# Patient Record
Sex: Male | Born: 1990
Health system: Southern US, Community
[De-identification: ages and names within clinical notes are randomized; demographics above are authoritative.]

## PROBLEM LIST (undated history)

## (undated) HISTORY — PX: TONSILLECTOMY: SUR1361

---

## 2011-05-26 ENCOUNTER — Ambulatory Visit (INDEPENDENT_AMBULATORY_CARE_PROVIDER_SITE_OTHER): Payer: 59 | Admitting: Pediatrics

## 2011-05-26 DIAGNOSIS — Z23 Encounter for immunization: Secondary | ICD-10-CM

## 2013-11-28 ENCOUNTER — Other Ambulatory Visit: Payer: Self-pay | Admitting: Geriatric Medicine

## 2013-11-28 DIAGNOSIS — R945 Abnormal results of liver function studies: Principal | ICD-10-CM

## 2013-11-28 DIAGNOSIS — R7989 Other specified abnormal findings of blood chemistry: Secondary | ICD-10-CM

## 2013-11-30 ENCOUNTER — Ambulatory Visit (HOSPITAL_COMMUNITY)
Admission: RE | Admit: 2013-11-30 | Discharge: 2013-11-30 | Disposition: A | Discharge status: Home / Self Care | Payer: 59 | Source: Ambulatory Visit | Attending: Geriatric Medicine | Admitting: Geriatric Medicine

## 2013-11-30 DIAGNOSIS — R945 Abnormal results of liver function studies: Secondary | ICD-10-CM | POA: Insufficient documentation

## 2013-11-30 DIAGNOSIS — R7989 Other specified abnormal findings of blood chemistry: Secondary | ICD-10-CM

## 2013-11-30 DIAGNOSIS — R933 Abnormal findings on diagnostic imaging of other parts of digestive tract: Secondary | ICD-10-CM | POA: Insufficient documentation

## 2014-05-20 ENCOUNTER — Encounter (HOSPITAL_COMMUNITY): Payer: Self-pay | Admitting: Emergency Medicine

## 2014-05-20 ENCOUNTER — Emergency Department (HOSPITAL_COMMUNITY)
Admission: EM | Admit: 2014-05-20 | Discharge: 2014-05-20 | Disposition: A | Discharge status: Home / Self Care | Payer: 59 | Source: Home / Self Care | Attending: Family Medicine | Admitting: Family Medicine

## 2014-05-20 DIAGNOSIS — Z4802 Encounter for removal of sutures: Secondary | ICD-10-CM

## 2014-05-20 MED ORDER — BACITRACIN ZINC 500 UNIT/GM EX OINT
TOPICAL_OINTMENT | CUTANEOUS | Status: AC
  Filled 2014-05-20: qty 0.9

## 2014-05-20 NOTE — Discharge Instructions (Signed)
Return as needed, 

## 2014-05-20 NOTE — ED Notes (Signed)
Wound check, poss suture removal

## 2014-05-20 NOTE — ED Provider Notes (Signed)
CSN: [id number]     Arrival date & time 05/20/14  1614 History   First MD Initiated Contact with Patient 05/20/14 1626     Chief Complaint  Patient presents with  . Wound Check   (Consider location/radiation/quality/duration/timing/severity/associated sxs/prior Treatment) Patient is a 23 y.o. male presenting with wound check. The history is provided by the patient.  Wound Check This is a new problem. The current episode started more than 1 week ago (11 d s/p lac repair out of town to left 5th finger , no complaints.). The problem has been rapidly improving.    History reviewed. No pertinent past medical history. History reviewed. No pertinent past surgical history. History reviewed. No pertinent family history. History  Substance Use Topics  . Smoking status: Never Smoker   . Smokeless tobacco: Not on file  . Alcohol Use: Not on file    Review of Systems  Constitutional: Negative.   Skin: Positive for wound.    Allergies  Amoxicillin and Penicillins  Home Medications   Prior to Admission medications   Medication Sig Start Date End Date Taking? Authorizing Provider  fexofenadine (ALLEGRA) 180 MG tablet Take 180 mg by mouth daily.   Yes Historical Provider, MD   BP 155/83  Pulse 90  Temp(Src) 97.9 F (36.6 C) (Oral)  Resp 16  SpO2 99% Physical Exam  Nursing note and vitals reviewed. Constitutional: He is oriented to person, place, and time. He appears well-developed and well-nourished.  Musculoskeletal: He exhibits no tenderness.  8 stitches to left 5th finger. Well healed, no problems. Sutures removed. dsd.  Neurological: He is alert and oriented to person, place, and time.  Skin: Skin is warm and dry.    ED Course  Procedures (including critical care time) Labs Review Labs Reviewed - No data to display  Imaging Review No results found.   MDM   1. Visit for suture removal        Linna HoffJames D Ed Rayson, MD 05/20/14 [phone]

## 2014-07-06 IMAGING — US US ABDOMEN COMPLETE
1 series · 14 of 25 positions shown · non-contrast
Comparison: None.

CLINICAL DATA: Elevated liver functional tests

EXAM:
ULTRASOUND ABDOMEN COMPLETE

[Series 1: us abdomen complete · 0.29mm/px · 14 of 89 slices shown]
[im 1/89]
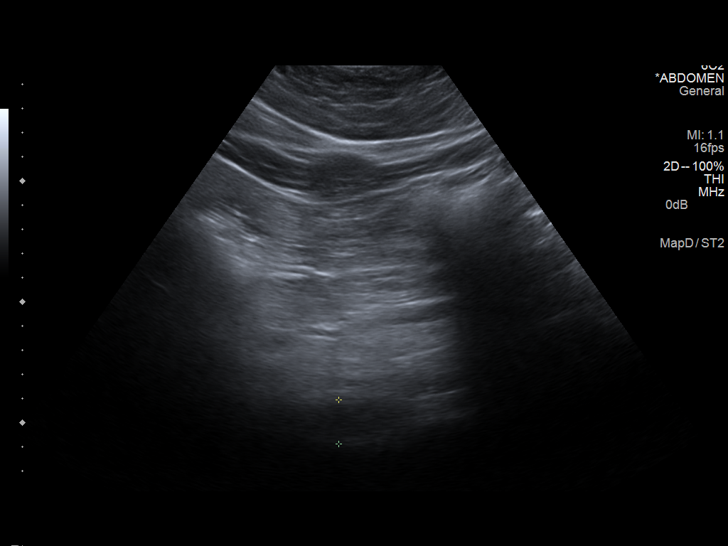
[im 8/89]
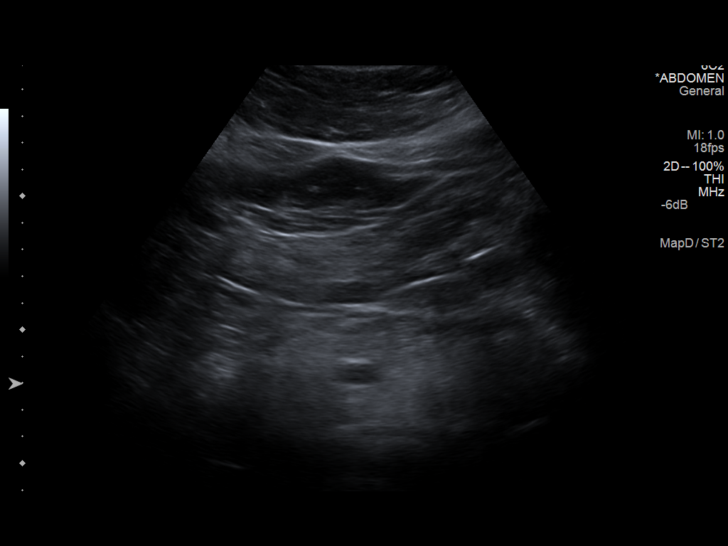
[im 15/89]
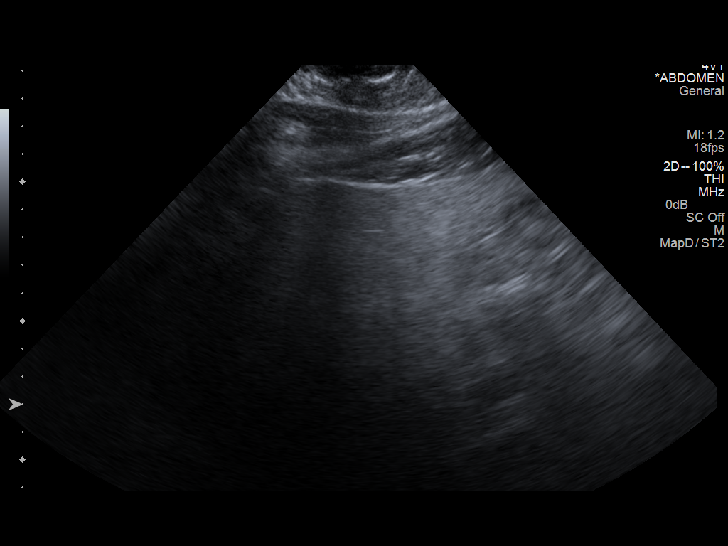
[im 23/89]
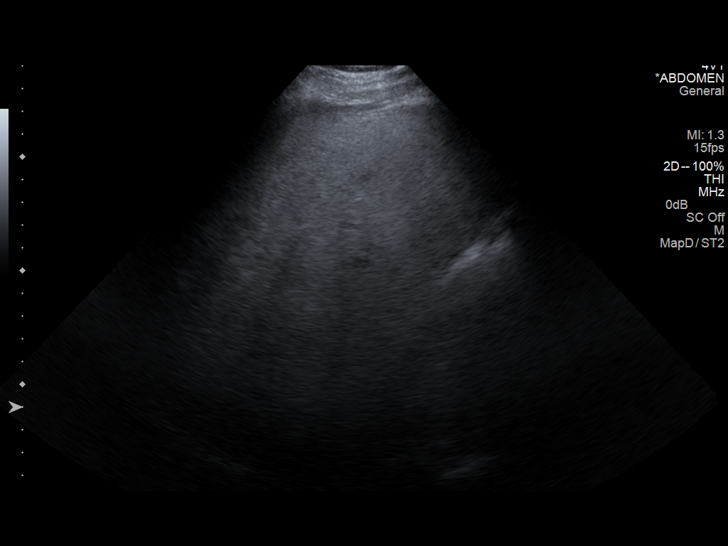
[im 30/89]
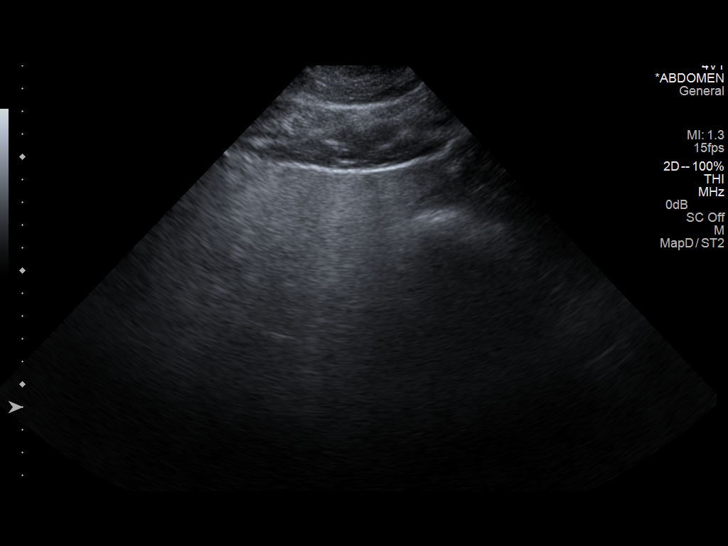
[im 34/89]
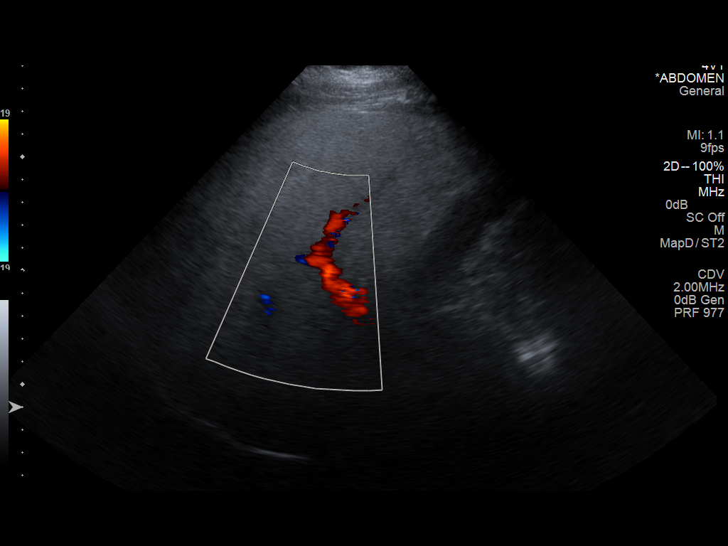
[im 41/89]
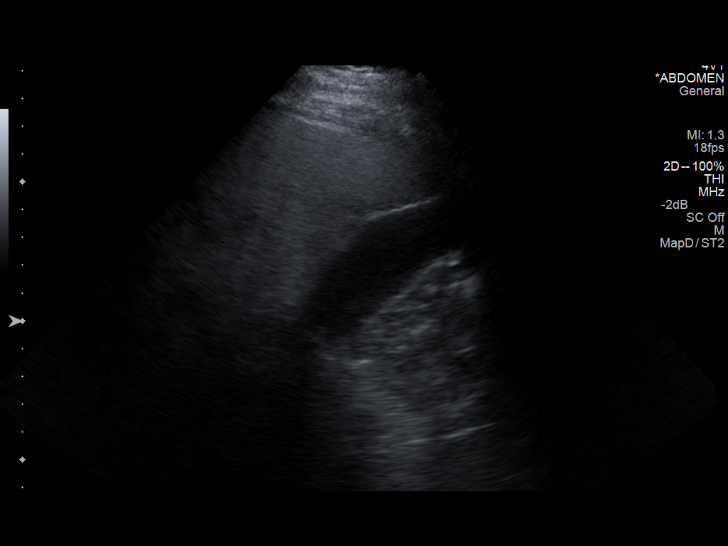
[im 48/89]
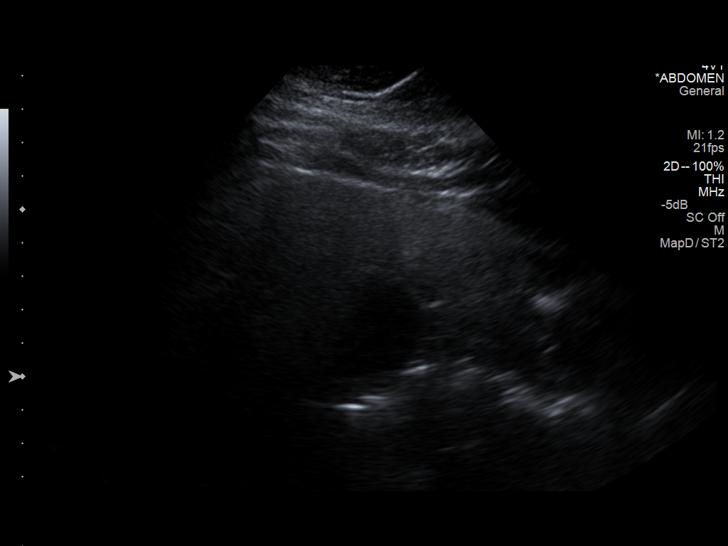
[im 56/89]
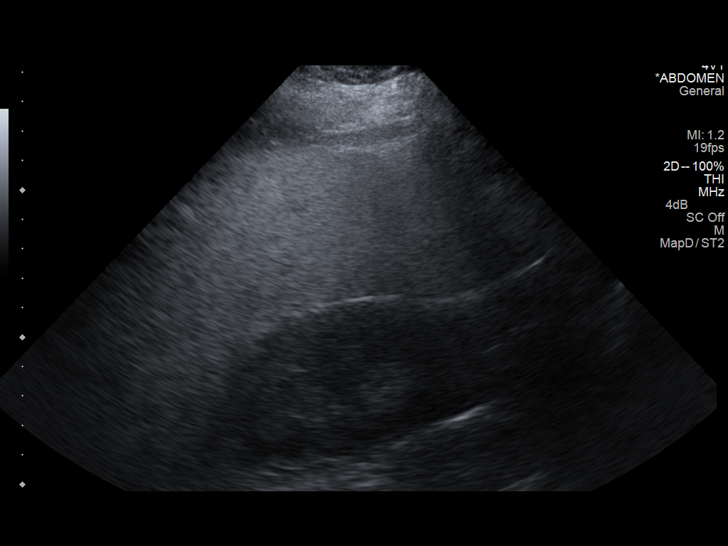
[im 59/89]
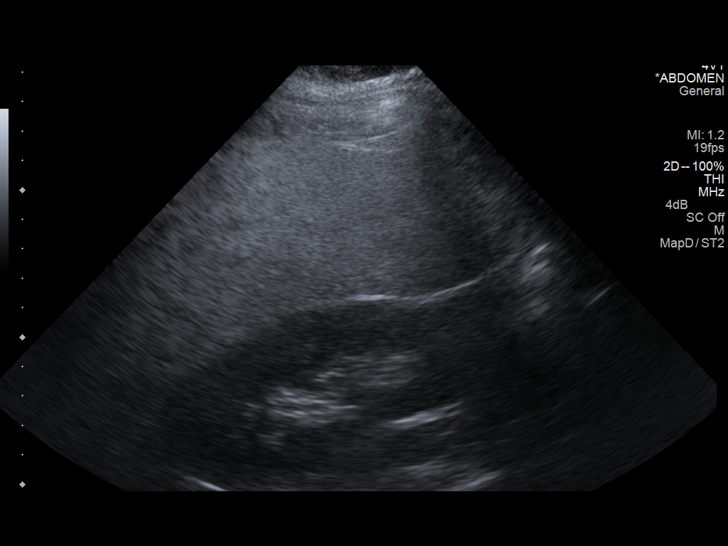
[im 67/89]
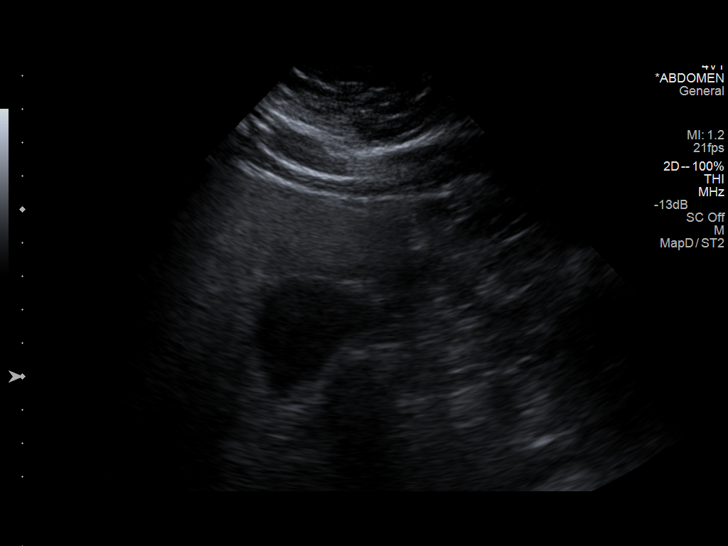
[im 74/89]
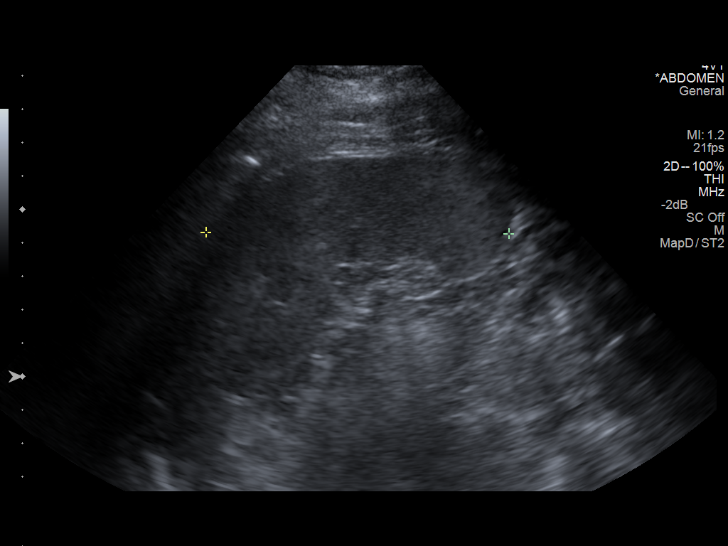
[im 81/89]
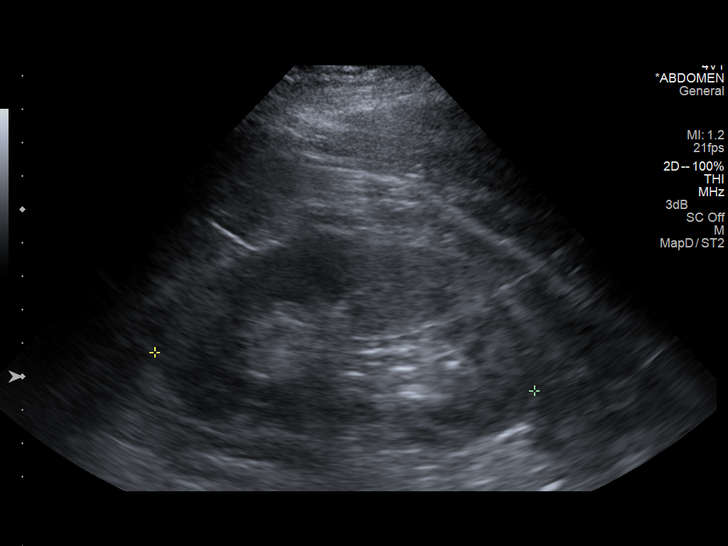
[im 89/89]
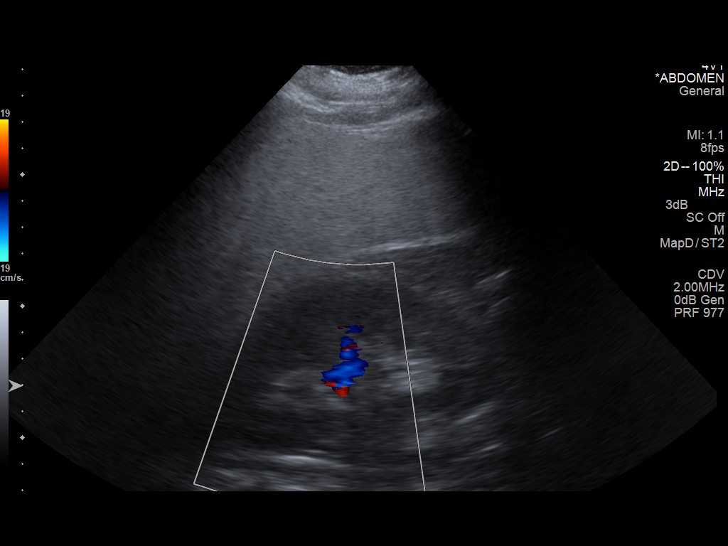

[14 of 25 positions shown; findings below may reference images not displayed]

FINDINGS: Gallbladder:

No gallstones or wall thickening visualized. No sonographic Murphy
sign noted.

Common bile duct:

Diameter:  4 mm.

Liver:

No focal hepatic lesion.  Increased diffuse hepatic echogenicity.

IVC:

No abnormality visualized.

Pancreas:

Visualized portion unremarkable.

Spleen:

Size and appearance within normal limits.

Right Kidney:

Length: 12.0 cm. Echogenicity within normal limits. No mass or
hydronephrosis visualized.

Left Kidney:

Length: 11.4 cm. Echogenicity within normal limits. No mass or
hydronephrosis visualized.

Abdominal aorta:

No aneurysm visualized.

Other findings:

None.
IMPRESSION: 1. Echogenic liver as can be seen with hepatic steatosis. Hepatic
ultrasound elastography may be helpful for evaluating hepatocellular
disease.

## 2016-09-09 DIAGNOSIS — J101 Influenza due to other identified influenza virus with other respiratory manifestations: Secondary | ICD-10-CM | POA: Diagnosis not present

## 2016-09-09 MED FILL — OSELTAMIVIR PHOS 75 MG CAP: 75 | 5 days supply | Qty: 10 | Fill #0

## 2017-01-18 DIAGNOSIS — J209 Acute bronchitis, unspecified: Secondary | ICD-10-CM | POA: Diagnosis not present

## 2017-01-18 MED FILL — DOXYCYCLINE HYCLATE 100 MG: 100 | 10 days supply | Qty: 20 | Fill #0

## 2017-01-18 MED FILL — CHERATUSSIN AC SYRUP: 100-10 | 10 days supply | Qty: 300 | Fill #0

## 2017-01-19 MED FILL — SULFAMETHOXAZOLE/TMP DS TAB: 800-160 | 7 days supply | Qty: 14 | Fill #0

## 2017-06-08 DIAGNOSIS — J069 Acute upper respiratory infection, unspecified: Secondary | ICD-10-CM | POA: Diagnosis not present

## 2017-08-22 DIAGNOSIS — J0110 Acute frontal sinusitis, unspecified: Secondary | ICD-10-CM | POA: Diagnosis not present

## 2017-08-22 MED FILL — DOXYCYCLINE HYCLATE 100 MG: 100 | 7 days supply | Qty: 14 | Fill #0

## 2017-08-29 DIAGNOSIS — Z6838 Body mass index (BMI) 38.0-38.9, adult: Secondary | ICD-10-CM | POA: Diagnosis not present

## 2017-08-29 DIAGNOSIS — R0982 Postnasal drip: Secondary | ICD-10-CM | POA: Diagnosis not present

## 2017-08-29 DIAGNOSIS — K760 Fatty (change of) liver, not elsewhere classified: Secondary | ICD-10-CM | POA: Diagnosis not present

## 2017-08-29 DIAGNOSIS — J0100 Acute maxillary sinusitis, unspecified: Secondary | ICD-10-CM | POA: Diagnosis not present

## 2017-08-29 DIAGNOSIS — R05 Cough: Secondary | ICD-10-CM | POA: Diagnosis not present

## 2017-08-29 DIAGNOSIS — E669 Obesity, unspecified: Secondary | ICD-10-CM | POA: Diagnosis not present

## 2017-08-29 MED FILL — SULFAMETHOXAZOLE-TMP DS TAB: 800-160 | 7 days supply | Qty: 14 | Fill #0

## 2017-10-19 DIAGNOSIS — R197 Diarrhea, unspecified: Secondary | ICD-10-CM | POA: Diagnosis not present

## 2017-10-19 DIAGNOSIS — R0981 Nasal congestion: Secondary | ICD-10-CM | POA: Diagnosis not present

## 2017-10-19 DIAGNOSIS — R05 Cough: Secondary | ICD-10-CM | POA: Diagnosis not present

## 2017-10-19 DIAGNOSIS — J0101 Acute recurrent maxillary sinusitis: Secondary | ICD-10-CM | POA: Diagnosis not present

## 2017-10-19 MED FILL — SULFAMETHOXAZOLE-TMP DS TAB: 800-160 | 7 days supply | Qty: 14 | Fill #0

## 2018-03-06 ENCOUNTER — Encounter (HOSPITAL_COMMUNITY): Payer: Self-pay | Admitting: Emergency Medicine

## 2018-03-06 ENCOUNTER — Ambulatory Visit (HOSPITAL_COMMUNITY)
Admission: EM | Admit: 2018-03-06 | Discharge: 2018-03-06 | Disposition: A | Discharge status: Home / Self Care | Payer: 59 | Attending: Family Medicine | Admitting: Family Medicine

## 2018-03-06 DIAGNOSIS — J018 Other acute sinusitis: Secondary | ICD-10-CM

## 2018-03-06 DIAGNOSIS — J069 Acute upper respiratory infection, unspecified: Secondary | ICD-10-CM | POA: Diagnosis not present

## 2018-03-06 MED ORDER — TRIAMCINOLONE ACETONIDE 55 MCG/ACT NA AERO: 2.0000 | INHALATION_SPRAY | Freq: Every day | NASAL | 12 refills | Status: AC

## 2018-03-06 MED ORDER — SALINE SPRAY 0.65 % NA SOLN
1.0000 | NASAL | 0 refills | Status: AC | PRN
Start: 2018-03-06 — End: ?

## 2018-03-06 MED ORDER — IBUPROFEN 800 MG PO TABS
800.0000 mg | ORAL_TABLET | Freq: Three times a day (TID) | ORAL | 0 refills | Status: AC
Start: 2018-03-06 — End: ?

## 2018-03-06 MED ORDER — GUAIFENESIN ER 600 MG PO TB12
1200.0000 mg | ORAL_TABLET | Freq: Two times a day (BID) | ORAL | 0 refills | Status: AC | PRN
Start: 2018-03-06 — End: 2018-03-11

## 2018-03-06 MED FILL — IBUPROFEN 800 MG TAB: 800 | 10 days supply | Qty: 30 | Fill #0

## 2018-03-06 NOTE — ED Triage Notes (Signed)
PT reports headache, facial pressure, congestion, cough, sore throat since Friday.

## 2018-03-06 NOTE — ED Provider Notes (Signed)
[facility]    CSN: [id number] Arrival date & time: 03/06/18  1304     History   Chief Complaint Chief Complaint  Patient presents with  . URI    HPI Kevin Park is a 27 y.o. male.   Kevin Park presents with complaints of productive cough, runny nose, facial pressure, sore throat, which started three days ago. No known fevers but had hot flashes last night. No known ill contacts. Has not taken any medications for symptoms. Symptoms felt worse this morning. Pain 5/10. Headache. No rash. No gi/gu complaints. Without contributing medical history.      ROS per HPI.      History reviewed. No pertinent past medical history.  There are no active problems to display for this patient.   Past Surgical History:  Procedure Laterality Date  . TONSILLECTOMY         Home Medications    Prior to Admission medications   Medication Sig Start Date End Date Taking? Authorizing Provider  fexofenadine (ALLEGRA) 180 MG tablet Take 180 mg by mouth daily.   Yes [provider]  guaiFENesin (MUCINEX) 600 MG 12 hr tablet Take 2 tablets (1,200 mg total) by mouth 2 (two) times daily as needed for up to 5 days. 03/06/18 03/11/18  Georgetta HaberBurky, Orman Matsumura B, NP  ibuprofen (ADVIL,MOTRIN) 800 MG tablet Take 1 tablet (800 mg total) by mouth 3 (three) times daily. 03/06/18   Georgetta HaberBurky, Kawhi Diebold B, NP  sodium chloride (OCEAN) 0.65 % SOLN nasal spray Place 1 spray into both nostrils as needed for congestion. 03/06/18   Georgetta HaberBurky, Charnel Giles B, NP  triamcinolone (NASACORT) 55 MCG/ACT AERO nasal inhaler Place 2 sprays into the nose daily. 03/06/18   Georgetta HaberBurky, Jahleel Stroschein B, NP    Family History No family history on file.  Social History Social History   Tobacco Use  . Smoking status: Never Smoker  Substance Use Topics  . Alcohol use: Not on file  . Drug use: Not on file     Allergies   Amoxicillin and Penicillins   Review of Systems Review of Systems   Physical Exam Triage Vital Signs ED Triage  Vitals  Enc Vitals Group     BP 03/06/18 1359 140/85     Pulse Rate 03/06/18 1359 83     Resp 03/06/18 1359 16     Temp 03/06/18 1359 98.4 F (36.9 C)     Temp Source 03/06/18 1359 Oral     SpO2 03/06/18 1359 98 %     Weight --      Height --      Head Circumference --      Peak Flow --      Pain Score 03/06/18 1403 6     Pain Loc --      Pain Edu? --      Excl. in GC? --    No data found.  Updated Vital Signs BP 140/85 (BP Location: Right Arm)   Pulse 83   Temp 98.4 F (36.9 C) (Oral)   Resp 16   SpO2 98%    Physical Exam  Constitutional: He is oriented to person, place, and time. He appears well-developed and well-nourished.  HENT:  Head: Normocephalic and atraumatic.  Right Ear: Tympanic membrane, external ear and ear canal normal.  Left Ear: Tympanic membrane, external ear and ear canal normal.  Nose: Right sinus exhibits maxillary sinus tenderness. Right sinus exhibits no frontal sinus tenderness. Left sinus exhibits maxillary sinus tenderness. Left sinus exhibits  no frontal sinus tenderness.  Mouth/Throat: Uvula is midline, oropharynx is clear and moist and mucous membranes are normal. Tonsils are 0 on the right. Tonsils are 0 on the left.  Eyes: Pupils are equal, round, and reactive to light. Conjunctivae are normal.  Neck: Normal range of motion.  Cardiovascular: Normal rate and regular rhythm.  Pulmonary/Chest: Effort normal and breath sounds normal.  Lymphadenopathy:    He has no cervical adenopathy.  Neurological: He is alert and oriented to person, place, and time.  Skin: Skin is warm and dry.  Vitals reviewed.    UC Treatments / Results  Labs (all labs ordered are listed, but only abnormal results are displayed) Labs Reviewed - No data to display  EKG None  Radiology No results found.  Procedures Procedures (including critical care time)  Medications Ordered in UC Medications - No data to display  Initial Impression / Assessment and Plan  / UC Course  I have reviewed the triage vital signs and the nursing notes.  Pertinent labs & imaging results that were available during my care of the patient were reviewed by me and considered in my medical decision making (see chart for details).     Non toxic. Afebrile. Benign physical exam. History and physical consistent with viral illness.  Supportive cares recommended. If symptoms worsen or do not improve in the next week to return to be seen or to follow up with PCP.  Patient verbalized understanding and agreeable to plan.    Final Clinical Impressions(s) / UC Diagnoses   Final diagnoses:  Viral upper respiratory tract infection  Acute non-recurrent sinusitis of other sinus     Discharge Instructions     Push fluids to ensure adequate hydration and keep secretions thin.  Tylenol and/or ibuprofen as needed for pain or fevers.  Daily nasacort. Plain nasal saline may also help with congestion and facial pressure, may use as needed throughout the day.  Mucinex may further help loosen secretions.  If symptoms worsen or do not improve in the next week to return to be seen or to follow up with your PCP.      ED Prescriptions    Medication Sig Dispense Auth. Provider   triamcinolone (NASACORT) 55 MCG/ACT AERO nasal inhaler Place 2 sprays into the nose daily. 1 Inhaler Linus Mako B, NP   guaiFENesin (MUCINEX) 600 MG 12 hr tablet Take 2 tablets (1,200 mg total) by mouth 2 (two) times daily as needed for up to 5 days. 20 tablet Linus Mako B, NP   ibuprofen (ADVIL,MOTRIN) 800 MG tablet Take 1 tablet (800 mg total) by mouth 3 (three) times daily. 30 tablet Linus Mako B, NP   sodium chloride (OCEAN) 0.65 % SOLN nasal spray Place 1 spray into both nostrils as needed for congestion. 60 mL Linus Mako B, NP     Controlled Substance Prescriptions [place] Controlled Substance Registry consulted? Not Applicable   Georgetta Haber, NP 03/06/18 1428

## 2018-03-06 NOTE — Discharge Instructions (Addendum)
Push fluids to ensure adequate hydration and keep secretions thin.  Tylenol and/or ibuprofen as needed for pain or fevers.  Daily nasacort. Plain nasal saline may also help with congestion and facial pressure, may use as needed throughout the day.  Mucinex may further help loosen secretions.  If symptoms worsen or do not improve in the next week to return to be seen or to follow up with your PCP.

## 2018-03-10 ENCOUNTER — Ambulatory Visit (HOSPITAL_COMMUNITY)
Admission: EM | Admit: 2018-03-10 | Discharge: 2018-03-10 | Disposition: A | Discharge status: Home / Self Care | Payer: 59 | Attending: Family Medicine | Admitting: Family Medicine

## 2018-03-10 ENCOUNTER — Encounter (HOSPITAL_COMMUNITY): Payer: Self-pay

## 2018-03-10 DIAGNOSIS — J019 Acute sinusitis, unspecified: Secondary | ICD-10-CM | POA: Diagnosis not present

## 2018-03-10 MED ORDER — CEFDINIR 300 MG PO CAPS: 300.0000 mg | ORAL_CAPSULE | Freq: Two times a day (BID) | ORAL | 0 refills | Status: AC

## 2018-03-10 MED FILL — DOXYCYCLINE HYCLATE 100 MG: 100 | 7 days supply | Qty: 14 | Fill #0

## 2018-03-10 NOTE — ED Triage Notes (Signed)
Pt presents with sinus and flu like symptoms (nasal drainage, congestion, coughing up mucus, body aches)

## 2018-03-10 NOTE — ED Provider Notes (Signed)
[facility]    CSN: [id number] Arrival date & time: 03/10/18  1052     History   Chief Complaint Chief Complaint  Patient presents with  . Facial Pain  . Influenza    HPI Kevin Park is a 27 y.o. male history of tonsillectomy, Patient is presenting with URI symptoms- congestion, cough, sore throat. Patient's main complaints are congestion, persistence of symptoms. Symptoms have been going on for over 1 week. Patient has tried Allegra, Mucinex, Nasacort, over-the-counter cough syrups, with minimal relief. Denies fever, nausea, vomiting, diarrhea. Denies shortness of breath and chest pain.    HPI  History reviewed. No pertinent past medical history.  There are no active problems to display for this patient.   Past Surgical History:  Procedure Laterality Date  . TONSILLECTOMY         Home Medications    Prior to Admission medications   Medication Sig Start Date End Date Taking? Authorizing Provider  cefdinir (OMNICEF) 300 MG capsule Take 1 capsule (300 mg total) by mouth 2 (two) times daily for 7 days. 03/10/18 03/17/18  Carle Dargan C, PA-C  fexofenadine (ALLEGRA) 180 MG tablet Take 180 mg by mouth daily.    [provider]  guaiFENesin (MUCINEX) 600 MG 12 hr tablet Take 2 tablets (1,200 mg total) by mouth 2 (two) times daily as needed for up to 5 days. 03/06/18 03/11/18  Georgetta Haber, NP  ibuprofen (ADVIL,MOTRIN) 800 MG tablet Take 1 tablet (800 mg total) by mouth 3 (three) times daily. 03/06/18   Georgetta Haber, NP  sodium chloride (OCEAN) 0.65 % SOLN nasal spray Place 1 spray into both nostrils as needed for congestion. 03/06/18   Georgetta Haber, NP  triamcinolone (NASACORT) 55 MCG/ACT AERO nasal inhaler Place 2 sprays into the nose daily. 03/06/18   Georgetta Haber, NP    Family History History reviewed. No pertinent family history.  Social History Social History   Tobacco Use  . Smoking status: Never Smoker  Substance Use Topics  .  Alcohol use: Not on file  . Drug use: Not on file     Allergies   Amoxicillin and Penicillins   Review of Systems Review of Systems  Constitutional: Negative for activity change, appetite change, chills, fatigue and fever.  HENT: Positive for congestion, rhinorrhea, sinus pressure and sore throat. Negative for ear pain and trouble swallowing.   Eyes: Negative for discharge and redness.  Respiratory: Positive for cough. Negative for chest tightness and shortness of breath.   Cardiovascular: Negative for chest pain.  Gastrointestinal: Negative for abdominal pain, diarrhea, nausea and vomiting.  Musculoskeletal: Positive for myalgias.  Skin: Negative for rash.  Neurological: Negative for dizziness, light-headedness and headaches.     Physical Exam Triage Vital Signs ED Triage Vitals [03/10/18 1108]  Enc Vitals Group     BP (!) 144/90     Pulse Rate 97     Resp 20     Temp 98.1 F (36.7 C)     Temp Source Oral     SpO2 98 %     Weight      Height      Head Circumference      Peak Flow      Pain Score      Pain Loc      Pain Edu?      Excl. in GC?    No data found.  Updated Vital Signs BP (!) 144/90 (BP Location: Right Arm)  Pulse 97   Temp 98.1 F (36.7 C) (Oral)   Resp 20   SpO2 98%   Visual Acuity Right Eye Distance:   Left Eye Distance:   Bilateral Distance:    Right Eye Near:   Left Eye Near:    Bilateral Near:     Physical Exam  Constitutional: He appears well-developed and well-nourished.  No acute distress  HENT:  Head: Normocephalic and atraumatic.  Bilateral ears without tenderness to palpation of external auricle, tragus and mastoid, EAC's without erythema or swelling, TM's with good bony landmarks and cone of light. Non erythematous.  Nasal mucosa erythematous, clear rhinorrhea present  Oral mucosa pink and moist, tonsils absent, posterior pharynx patent and erythematous, no uvula deviation or swelling. Normal phonation.   Eyes:  Conjunctivae are normal.  Neck: Neck supple.  Cardiovascular: Normal rate and regular rhythm.  No murmur heard. Pulmonary/Chest: Effort normal and breath sounds normal. No respiratory distress.  Breathing comfortably at rest, CTABL, no wheezing, rales or other adventitious sounds auscultated  Abdominal: Soft. There is no tenderness.  Musculoskeletal: He exhibits no edema.  Neurological: He is alert.  Skin: Skin is warm and dry.  Psychiatric: He has a normal mood and affect.  Nursing note and vitals reviewed.    UC Treatments / Results  Labs (all labs ordered are listed, but only abnormal results are displayed) Labs Reviewed - No data to display  EKG None  Radiology No results found.  Procedures Procedures (including critical care time)  Medications Ordered in UC Medications - No data to display  Initial Impression / Assessment and Plan / UC Course  I have reviewed the triage vital signs and the nursing notes.  Pertinent labs & imaging results that were available during my care of the patient were reviewed by me and considered in my medical decision making (see chart for details).     Patient with URI symptoms, viral versus sinusitis.  Given length of symptoms and worsening of symptoms despite symptomatic management, will provide with Omnicef to treat for sinusitis.  Penicillin allergic with rash.  Advised to continue symptomatic management.Discussed strict return precautions. Patient verbalized understanding and is agreeable with plan.  Final Clinical Impressions(s) / UC Diagnoses   Final diagnoses:  Acute sinusitis with symptoms > 10 days     Discharge Instructions     Please continue to use the Mucinex, Allegra and Nasacort Continue over-the-counter cough syrups Add in [place] twice daily for the next week Tylenol and ibuprofen for any fevers, pain, body aches  Return if developing shortness of breath, difficulty breathing, fevers, continuing worsening  symptoms, persistent symptoms without relief with treatment.   ED Prescriptions    Medication Sig Dispense Auth. Provider   cefdinir (OMNICEF) 300 MG capsule Take 1 capsule (300 mg total) by mouth 2 (two) times daily for 7 days. 14 capsule Seth Friedlander C, PA-C     Controlled Substance Prescriptions [place] Controlled Substance Registry consulted? Not Applicable   Lew DawesWieters, Westyn Keatley C, [place] JerseyPA-C 03/10/18 1127

## 2018-03-10 NOTE — Discharge Instructions (Signed)
Please continue to use the Mucinex, Allegra and Nasacort Continue over-the-counter cough syrups Add in [place] twice daily for the next week Tylenol and ibuprofen for any fevers, pain, body aches  Return if developing shortness of breath, difficulty breathing, fevers, continuing worsening symptoms, persistent symptoms without relief with treatment.

## 2018-06-12 DIAGNOSIS — J101 Influenza due to other identified influenza virus with other respiratory manifestations: Secondary | ICD-10-CM | POA: Diagnosis not present

## 2018-06-12 MED FILL — predniSONE 10 MG TABS: 10 | 6 days supply | Qty: 21 | Fill #0

## 2018-06-12 MED FILL — OSELTAMIVIR PHOSPHATE 75 MG: 75 | 5 days supply | Qty: 10 | Fill #0

## 2018-06-12 MED FILL — HYDROCODONE-CHLORPHEN ER SU: 10-8 | 10 days supply | Qty: 100 | Fill #0

## 2018-06-28 ENCOUNTER — Ambulatory Visit
Admission: RE | Admit: 2018-06-28 | Discharge: 2018-06-28 | Disposition: A | Discharge status: Home / Self Care | Payer: 59 | Source: Ambulatory Visit | Attending: Nurse Practitioner | Admitting: Nurse Practitioner

## 2018-06-28 ENCOUNTER — Other Ambulatory Visit: Payer: Self-pay | Admitting: Nurse Practitioner

## 2018-06-28 DIAGNOSIS — A689 Relapsing fever, unspecified: Secondary | ICD-10-CM

## 2018-06-28 MED FILL — CIPROFLOXACIN HCL 750 MG TA: 750 | 10 days supply | Qty: 20 | Fill #0

## 2018-07-03 DIAGNOSIS — J189 Pneumonia, unspecified organism: Secondary | ICD-10-CM | POA: Diagnosis not present

## 2018-07-03 DIAGNOSIS — R300 Dysuria: Secondary | ICD-10-CM | POA: Diagnosis not present

## 2018-07-04 ENCOUNTER — Other Ambulatory Visit (HOSPITAL_COMMUNITY): Payer: Self-pay | Admitting: *Deleted

## 2018-07-05 ENCOUNTER — Ambulatory Visit (HOSPITAL_COMMUNITY)
Admission: RE | Admit: 2018-07-05 | Discharge: 2018-07-05 | Disposition: A | Discharge status: Home / Self Care | Payer: 59 | Source: Ambulatory Visit | Attending: Nurse Practitioner | Admitting: Nurse Practitioner

## 2018-07-05 DIAGNOSIS — R42 Dizziness and giddiness: Secondary | ICD-10-CM | POA: Insufficient documentation

## 2018-07-05 DIAGNOSIS — J189 Pneumonia, unspecified organism: Secondary | ICD-10-CM | POA: Diagnosis not present

## 2018-07-05 MED ORDER — SODIUM CHLORIDE 0.9 % IV SOLN
INTRAVENOUS | Status: AC
  Administered 2018-07-05 (×2): via INTRAVENOUS

## 2018-07-05 MED ORDER — CIPROFLOXACIN IN D5W 400 MG/200ML IV SOLN
400.0000 mg | Freq: Once | INTRAVENOUS | Status: AC
  Administered 2018-07-05: 400 mg via INTRAVENOUS
  Filled 2018-07-05: qty 200

## 2018-12-18 DIAGNOSIS — Z20828 Contact with and (suspected) exposure to other viral communicable diseases: Secondary | ICD-10-CM | POA: Diagnosis not present

## 2019-02-01 IMAGING — DX DG CHEST 2V
2 series · 2 of 2 positions shown · non-contrast
Comparison: None.

CLINICAL DATA: Fever

EXAM:
CHEST - 2 VIEW

[dg chest 2 view (1 of 2)]
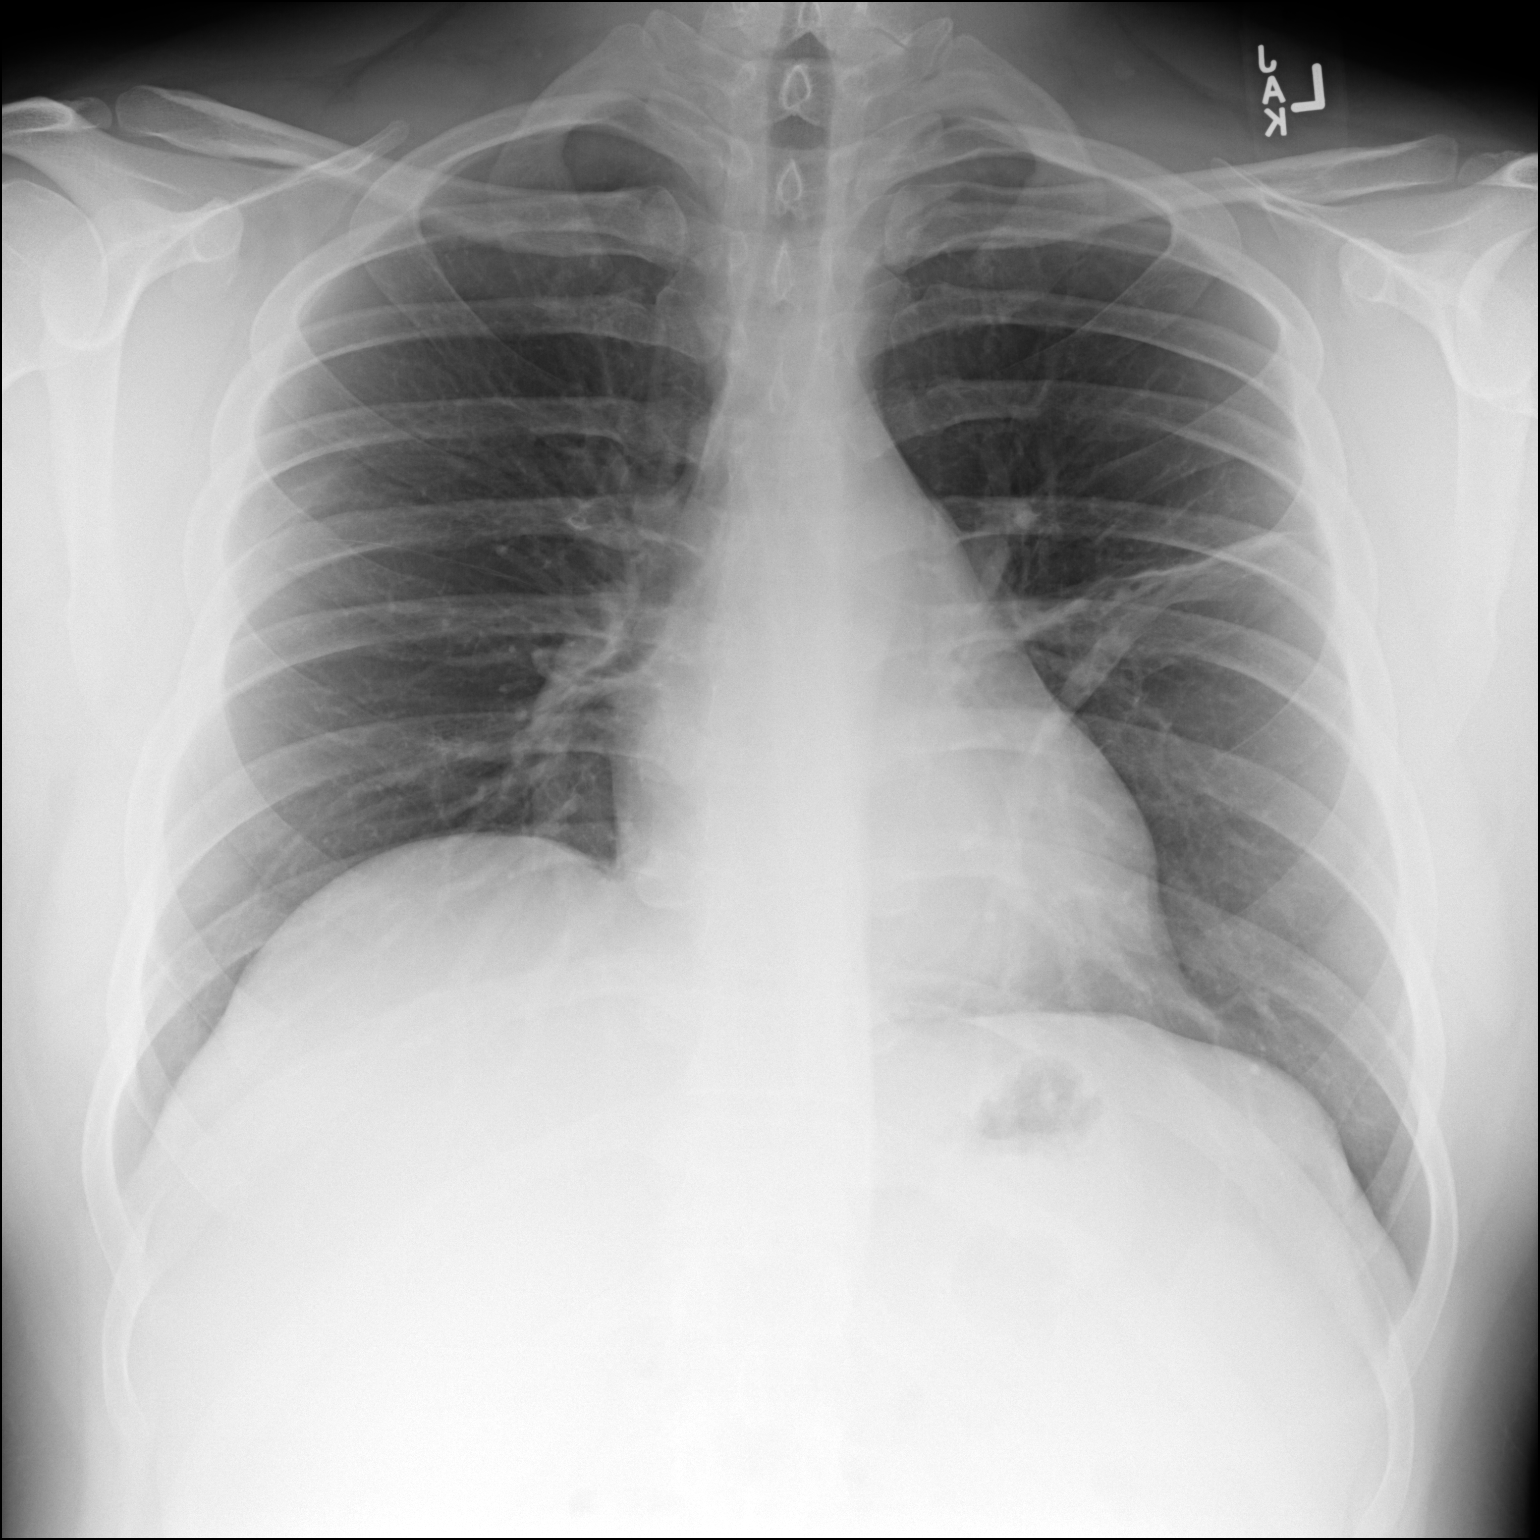

[dg chest 2 view (2 of 2)]
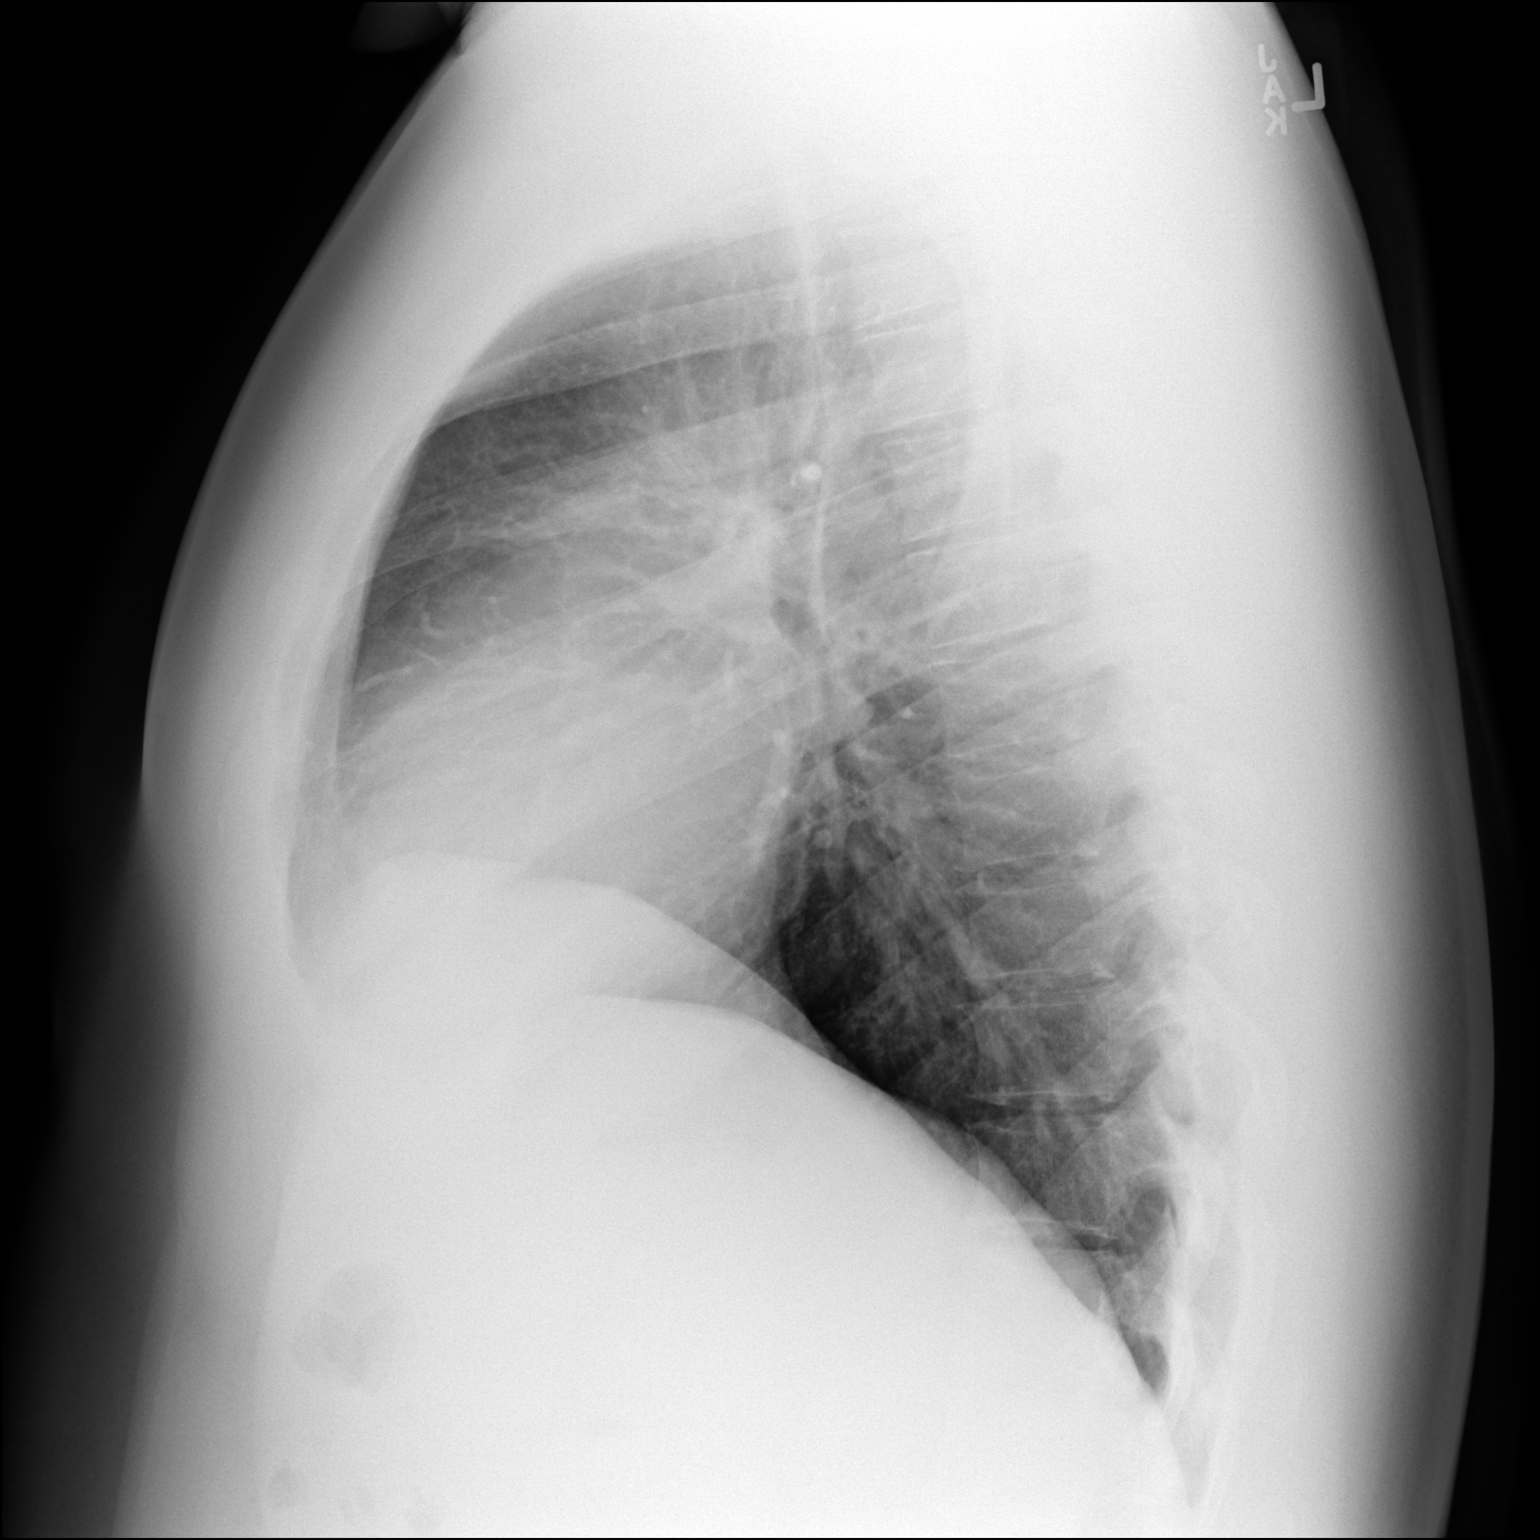

[2 of 2 positions shown; findings below may reference images not displayed]

FINDINGS: There is atelectatic change in the left mid lung anteriorly. The
lungs elsewhere are clear. Heart size and pulmonary vascularity are
normal. No adenopathy. No bone lesions.
IMPRESSION: Atelectatic change left mid lung. Early pneumonia superimposed in
this area cannot be excluded.

Lungs elsewhere clear.  Cardiac silhouette normal.

These results will be called to the ordering clinician or
representative by the [HOSPITAL] at the imaging location.
